# Patient Record
Sex: Male | Born: 1980 | Race: Black or African American | Hispanic: No | Marital: Single | Smoking: Heavy tobacco smoker
Health system: Southern US, Community
[De-identification: ages and names within clinical notes are randomized; demographics above are authoritative.]

## PROBLEM LIST (undated history)

## (undated) HISTORY — PX: OTHER SURGICAL HISTORY: SHX169

---

## 2008-12-09 ENCOUNTER — Observation Stay (HOSPITAL_COMMUNITY): Admission: EM | Admit: 2008-12-09 | Discharge: 2008-12-10 | Payer: Self-pay | Admitting: Emergency Medicine

## 2009-06-03 ENCOUNTER — Emergency Department (HOSPITAL_COMMUNITY): Admission: EM | Admit: 2009-06-03 | Discharge: 2009-06-03 | Payer: Self-pay | Admitting: Emergency Medicine

## 2009-06-24 ENCOUNTER — Emergency Department (HOSPITAL_COMMUNITY): Admission: EM | Admit: 2009-06-24 | Discharge: 2009-06-24 | Payer: Self-pay | Admitting: Emergency Medicine

## 2009-08-28 ENCOUNTER — Emergency Department (HOSPITAL_COMMUNITY): Admission: EM | Admit: 2009-08-28 | Discharge: 2009-08-28 | Payer: Self-pay | Admitting: Emergency Medicine

## 2009-09-11 ENCOUNTER — Emergency Department (HOSPITAL_COMMUNITY): Admission: EM | Admit: 2009-09-11 | Discharge: 2009-09-11 | Payer: Self-pay | Admitting: Emergency Medicine

## 2009-09-26 ENCOUNTER — Emergency Department (HOSPITAL_COMMUNITY): Admission: EM | Admit: 2009-09-26 | Discharge: 2009-09-26 | Payer: Self-pay | Admitting: Emergency Medicine

## 2009-10-09 ENCOUNTER — Emergency Department (HOSPITAL_COMMUNITY): Admission: EM | Admit: 2009-10-09 | Discharge: 2009-10-09 | Payer: Self-pay | Admitting: Family Medicine

## 2009-10-21 ENCOUNTER — Emergency Department (HOSPITAL_COMMUNITY): Admission: EM | Admit: 2009-10-21 | Discharge: 2009-10-22 | Payer: Self-pay | Admitting: Emergency Medicine

## 2009-10-28 ENCOUNTER — Emergency Department (HOSPITAL_COMMUNITY): Admission: EM | Admit: 2009-10-28 | Discharge: 2009-10-28 | Payer: Self-pay | Admitting: Emergency Medicine

## 2009-10-30 ENCOUNTER — Emergency Department (HOSPITAL_COMMUNITY): Admission: EM | Admit: 2009-10-30 | Discharge: 2009-10-30 | Payer: Self-pay | Admitting: Emergency Medicine

## 2009-12-23 ENCOUNTER — Emergency Department (HOSPITAL_COMMUNITY): Admission: EM | Admit: 2009-12-23 | Discharge: 2009-12-23 | Payer: Self-pay | Admitting: Family Medicine

## 2010-01-15 ENCOUNTER — Emergency Department (HOSPITAL_COMMUNITY): Admission: EM | Admit: 2010-01-15 | Discharge: 2010-01-15 | Payer: Self-pay | Admitting: Family Medicine

## 2010-11-18 LAB — DIFFERENTIAL
Basophils Relative: 2 % — ABNORMAL HIGH (ref 0–1)
Monocytes Absolute: 0.7 10*3/uL (ref 0.1–1.0)
Monocytes Relative: 8 % (ref 3–12)
Neutro Abs: 6.4 10*3/uL (ref 1.7–7.7)
Neutrophils Relative %: 66 % (ref 43–77)

## 2010-11-18 LAB — CBC
HCT: 48.8 % (ref 39.0–52.0)
Hemoglobin: 16.8 g/dL (ref 13.0–17.0)
MCHC: 34.4 g/dL (ref 30.0–36.0)
MCV: 95.9 fL (ref 78.0–100.0)
Platelets: 152 10*3/uL (ref 150–400)
RDW: 13 % (ref 11.5–15.5)

## 2010-11-18 LAB — BASIC METABOLIC PANEL
Chloride: 107 mEq/L (ref 96–112)
GFR calc non Af Amer: >60 mL/min (ref >60)

## 2010-11-18 LAB — CULTURE, BLOOD (ROUTINE X 2)
Culture: NO GROWTH
Culture: NO GROWTH

## 2011-06-13 ENCOUNTER — Emergency Department (HOSPITAL_COMMUNITY): Payer: Self-pay

## 2011-06-13 ENCOUNTER — Inpatient Hospital Stay (HOSPITAL_COMMUNITY)
Admission: EM | Admit: 2011-06-13 | Discharge: 2011-06-16 | DRG: 565 | Disposition: A | Discharge status: Home / Self Care | Payer: Self-pay | Attending: General Surgery | Admitting: General Surgery

## 2011-06-13 DIAGNOSIS — S0285XA Fracture of orbit, unspecified, initial encounter for closed fracture: Secondary | ICD-10-CM | POA: Diagnosis present

## 2011-06-13 DIAGNOSIS — S62639B Displaced fracture of distal phalanx of unspecified finger, initial encounter for open fracture: Secondary | ICD-10-CM | POA: Diagnosis present

## 2011-06-13 DIAGNOSIS — S6000XA Contusion of unspecified finger without damage to nail, initial encounter: Secondary | ICD-10-CM | POA: Diagnosis present

## 2011-06-13 DIAGNOSIS — S62637B Displaced fracture of distal phalanx of left little finger, initial encounter for open fracture: Secondary | ICD-10-CM | POA: Diagnosis present

## 2011-06-13 DIAGNOSIS — S060X9A Concussion with loss of consciousness of unspecified duration, initial encounter: Secondary | ICD-10-CM | POA: Diagnosis present

## 2011-06-13 DIAGNOSIS — S0180XA Unspecified open wound of other part of head, initial encounter: Secondary | ICD-10-CM

## 2011-06-13 DIAGNOSIS — F1210 Cannabis abuse, uncomplicated: Secondary | ICD-10-CM | POA: Diagnosis present

## 2011-06-13 DIAGNOSIS — S0280XA Fracture of other specified skull and facial bones, unspecified side, initial encounter for closed fracture: Principal | ICD-10-CM | POA: Diagnosis present

## 2011-06-13 DIAGNOSIS — F1010 Alcohol abuse, uncomplicated: Secondary | ICD-10-CM | POA: Diagnosis present

## 2011-06-13 DIAGNOSIS — S0100XA Unspecified open wound of scalp, initial encounter: Secondary | ICD-10-CM | POA: Diagnosis present

## 2011-06-13 DIAGNOSIS — S022XXA Fracture of nasal bones, initial encounter for closed fracture: Secondary | ICD-10-CM | POA: Diagnosis present

## 2011-06-13 DIAGNOSIS — S0181XA Laceration without foreign body of other part of head, initial encounter: Secondary | ICD-10-CM | POA: Diagnosis present

## 2011-06-13 DIAGNOSIS — S0230XA Fracture of orbital floor, unspecified side, initial encounter for closed fracture: Secondary | ICD-10-CM | POA: Diagnosis present

## 2011-06-13 DIAGNOSIS — F17200 Nicotine dependence, unspecified, uncomplicated: Secondary | ICD-10-CM | POA: Diagnosis present

## 2011-06-13 DIAGNOSIS — IMO0001 Reserved for inherently not codable concepts without codable children: Secondary | ICD-10-CM | POA: Diagnosis present

## 2011-06-13 DIAGNOSIS — S01409A Unspecified open wound of unspecified cheek and temporomandibular area, initial encounter: Secondary | ICD-10-CM | POA: Diagnosis present

## 2011-06-13 DIAGNOSIS — S060XAA Concussion with loss of consciousness status unknown, initial encounter: Secondary | ICD-10-CM | POA: Diagnosis present

## 2011-06-13 LAB — URINALYSIS, ROUTINE W REFLEX MICROSCOPIC
Bilirubin Urine: NEGATIVE
Glucose, UA: 250 mg/dL — AB
Leukocytes, UA: NEGATIVE
Nitrite: NEGATIVE

## 2011-06-13 LAB — DIFFERENTIAL
Basophils Absolute: 0.1 10*3/uL (ref 0.0–0.1)
Basophils Relative: 1 % (ref 0–1)
Eosinophils Absolute: 0.4 10*3/uL (ref 0.0–0.7)
Eosinophils Relative: 3 % (ref 0–5)
Lymphs Abs: 5.1 10*3/uL — ABNORMAL HIGH (ref 0.7–4.0)
Monocytes Absolute: 0.8 10*3/uL (ref 0.1–1.0)

## 2011-06-13 LAB — CBC
Hemoglobin: 16.5 g/dL (ref 13.0–17.0)
MCHC: 35.5 g/dL (ref 30.0–36.0)
RBC: 5.15 MIL/uL (ref 4.22–5.81)

## 2011-06-13 LAB — RAPID URINE DRUG SCREEN, HOSP PERFORMED
Barbiturates: NOT DETECTED
Cocaine: POSITIVE — AB
Tetrahydrocannabinol: POSITIVE — AB

## 2011-06-13 LAB — POCT I-STAT, CHEM 8
Chloride: 103 mEq/L (ref 96–112)
Potassium: 3 mEq/L — ABNORMAL LOW (ref 3.5–5.1)

## 2011-06-13 LAB — SAMPLE TO BLOOD BANK

## 2011-06-13 LAB — URINE MICROSCOPIC-ADD ON

## 2011-06-13 LAB — ETHANOL: Alcohol, Ethyl (B): 18 mg/dL — ABNORMAL HIGH (ref 0–11)

## 2011-06-13 MED ORDER — HYDROMORPHONE 0.3 MG/ML IV SOLN
INTRAVENOUS | Status: DC
  Administered 2011-06-14: 1.39 mg via INTRAVENOUS
  Administered 2011-06-14: 0.4 mg via INTRAVENOUS
  Administered 2011-06-14: 0.6 mg via INTRAVENOUS
  Administered 2011-06-14: 0.599 mg via INTRAVENOUS
  Administered 2011-06-14: 1.59 mg via INTRAVENOUS
  Administered 2011-06-14: 0.799 mg via INTRAVENOUS
  Filled 2011-06-13: qty 25

## 2011-06-13 MED ORDER — DIPHENHYDRAMINE HCL 12.5 MG/5ML PO ELIX
25.0000 mg | ORAL_SOLUTION | Freq: Four times a day (QID) | ORAL | Status: DC | PRN
  Filled 2011-06-13: qty 10

## 2011-06-13 MED ORDER — INFLUENZA VIRUS VACC SPLIT PF IM SUSP
0.5000 mL | Freq: Once | INTRAMUSCULAR | Status: AC
  Administered 2011-06-14: 0.5 mL via INTRAMUSCULAR
  Filled 2011-06-13: qty 0.5

## 2011-06-13 MED ORDER — ENOXAPARIN SODIUM 30 MG/0.3ML ~~LOC~~ SOLN
30.0000 mg | Freq: Two times a day (BID) | SUBCUTANEOUS | Status: DC
  Administered 2011-06-14 – 2011-06-15 (×4): 30 mg via SUBCUTANEOUS
  Filled 2011-06-13 (×8): qty 0.3

## 2011-06-13 MED ORDER — CEFAZOLIN SODIUM-DEXTROSE 2-3 GM-% IV SOLR
2.0000 g | Freq: Three times a day (TID) | INTRAVENOUS | Status: DC
  Administered 2011-06-14 – 2011-06-16 (×7): 2 g via INTRAVENOUS
  Filled 2011-06-13 (×11): qty 50

## 2011-06-13 MED ORDER — DIPHENHYDRAMINE HCL 50 MG/ML IJ SOLN: 12.5000 mg | Freq: Four times a day (QID) | INTRAMUSCULAR | Status: DC | PRN

## 2011-06-13 MED ORDER — SODIUM CHLORIDE 0.9 % IJ SOLN: 9.0000 mL | INTRAMUSCULAR | Status: DC | PRN

## 2011-06-13 MED ORDER — NALOXONE HCL 0.4 MG/ML IJ SOLN: 0.4000 mg | INTRAMUSCULAR | Status: DC | PRN

## 2011-06-13 MED ORDER — DOCUSATE SODIUM 100 MG PO CAPS
100.0000 mg | ORAL_CAPSULE | Freq: Two times a day (BID) | ORAL | Status: DC
  Administered 2011-06-14 – 2011-06-15 (×4): 100 mg via ORAL
  Filled 2011-06-13 (×2): qty 1

## 2011-06-13 MED ORDER — ONDANSETRON HCL 4 MG PO TABS: 4.0000 mg | ORAL_TABLET | ORAL | Status: DC | PRN

## 2011-06-13 MED ORDER — POLYETHYLENE GLYCOL 3350 17 G PO PACK
17.0000 g | PACK | Freq: Every day | ORAL | Status: DC
  Administered 2011-06-14 – 2011-06-15 (×2): 17 g via ORAL
  Filled 2011-06-13 (×4): qty 1

## 2011-06-13 MED ORDER — ONDANSETRON HCL 4 MG/2ML IJ SOLN: 4.0000 mg | Freq: Four times a day (QID) | INTRAMUSCULAR | Status: DC | PRN

## 2011-06-13 MED ORDER — BISACODYL 10 MG RE SUPP: 10.0000 mg | Freq: Every day | RECTAL | Status: DC | PRN

## 2011-06-13 MED ORDER — HYDROMORPHONE 0.3 MG/ML IV SOLN: INTRAVENOUS | Status: DC

## 2011-06-13 MED ORDER — SODIUM CHLORIDE 0.45 % IV SOLN
INTRAVENOUS | Status: DC
  Administered 2011-06-14 (×2): via INTRAVENOUS

## 2011-06-14 DIAGNOSIS — S0285XA Fracture of orbit, unspecified, initial encounter for closed fracture: Secondary | ICD-10-CM | POA: Diagnosis present

## 2011-06-14 DIAGNOSIS — S060XAA Concussion with loss of consciousness status unknown, initial encounter: Secondary | ICD-10-CM | POA: Diagnosis present

## 2011-06-14 DIAGNOSIS — F1290 Cannabis use, unspecified, uncomplicated: Secondary | ICD-10-CM | POA: Insufficient documentation

## 2011-06-14 DIAGNOSIS — Z7289 Other problems related to lifestyle: Secondary | ICD-10-CM | POA: Insufficient documentation

## 2011-06-14 DIAGNOSIS — S62637B Displaced fracture of distal phalanx of left little finger, initial encounter for open fracture: Secondary | ICD-10-CM | POA: Diagnosis present

## 2011-06-14 DIAGNOSIS — S0181XA Laceration without foreign body of other part of head, initial encounter: Secondary | ICD-10-CM | POA: Diagnosis present

## 2011-06-14 DIAGNOSIS — Z720 Tobacco use: Secondary | ICD-10-CM | POA: Insufficient documentation

## 2011-06-14 LAB — CBC
MCH: 32.6 pg (ref 26.0–34.0)
MCV: 91.1 fL (ref 78.0–100.0)
Platelets: 192 10*3/uL (ref 150–400)
RBC: 4.82 MIL/uL (ref 4.22–5.81)
RDW: 12.9 % (ref 11.5–15.5)
WBC: 9.2 10*3/uL (ref 4.0–10.5)

## 2011-06-14 LAB — COMPREHENSIVE METABOLIC PANEL
ALT: 19 U/L (ref 0–53)
BUN: 5 mg/dL — ABNORMAL LOW (ref 6–23)
CO2: 25 mEq/L (ref 19–32)
Calcium: 9.4 mg/dL (ref 8.4–10.5)
Creatinine, Ser: 0.9 mg/dL (ref 0.50–1.35)
GFR calc Af Amer: >90 mL/min (ref >90)
GFR calc non Af Amer: >90 mL/min (ref >90)
Glucose, Bld: 112 mg/dL — ABNORMAL HIGH (ref 70–99)
Sodium: 137 mEq/L (ref 135–145)

## 2011-06-14 MED ORDER — HYPROMELLOSE (GONIOSCOPIC) 2.5 % OP SOLN
2.0000 [drp] | Freq: Four times a day (QID) | OPHTHALMIC | Status: DC
  Filled 2011-06-14: qty 15

## 2011-06-14 MED ORDER — WHITE PETROLATUM GEL
Status: AC
  Administered 2011-06-14: 21:00:00
  Filled 2011-06-14: qty 5

## 2011-06-14 MED ORDER — POLYVINYL ALCOHOL 1.4 % OP SOLN
2.0000 [drp] | Freq: Four times a day (QID) | OPHTHALMIC | Status: DC
  Administered 2011-06-14 – 2011-06-15 (×8): 2 [drp] via OPHTHALMIC
  Filled 2011-06-14 (×3): qty 15

## 2011-06-14 MED ORDER — HYDROMORPHONE 0.3 MG/ML IV SOLN: INTRAVENOUS | Status: DC
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
## 2011-06-15 MED ORDER — CEPHALEXIN 500 MG PO CAPS: 500.0000 mg | ORAL_CAPSULE | Freq: Four times a day (QID) | ORAL | Status: AC

## 2011-06-15 MED ORDER — MORPHINE SULFATE 4 MG/ML IJ SOLN
4.0000 mg | INTRAMUSCULAR | Status: DC | PRN
  Filled 2011-06-15: qty 1

## 2011-06-15 MED ORDER — LORAZEPAM 1 MG PO TABS: 0.0000 mg | ORAL_TABLET | Freq: Two times a day (BID) | ORAL | Status: DC

## 2011-06-15 MED ORDER — BACITRACIN-NEOMYCIN-POLYMYXIN 400-5-5000 EX OINT
TOPICAL_OINTMENT | CUTANEOUS | Status: AC
  Filled 2011-06-15: qty 2

## 2011-06-15 MED ORDER — BACITRACIN-NEOMYCIN-POLYMYXIN 400-5-5000 EX OINT
TOPICAL_OINTMENT | CUTANEOUS | Status: AC
  Filled 2011-06-15: qty 1

## 2011-06-15 MED ORDER — LORAZEPAM 1 MG PO TABS: 0.0000 mg | ORAL_TABLET | Freq: Four times a day (QID) | ORAL | Status: DC

## 2011-06-15 MED ORDER — LORAZEPAM 1 MG PO TABS: 1.0000 mg | ORAL_TABLET | Freq: Four times a day (QID) | ORAL | Status: DC | PRN

## 2011-06-15 MED ORDER — HYDROCODONE-ACETAMINOPHEN 5-325 MG PO TABS
1.0000 | ORAL_TABLET | ORAL | Status: DC | PRN
  Administered 2011-06-15 – 2011-06-16 (×4): 2 via ORAL
  Filled 2011-06-15 (×4): qty 2

## 2011-06-15 MED ORDER — HYDROCODONE-ACETAMINOPHEN 5-325 MG PO TABS: 1.0000 | ORAL_TABLET | ORAL | Status: AC | PRN

## 2011-06-15 MED ORDER — THERA M PLUS PO TABS
1.0000 | ORAL_TABLET | Freq: Every day | ORAL | Status: DC
  Administered 2011-06-15: 1 via ORAL
  Filled 2011-06-15 (×2): qty 1

## 2011-06-15 MED ORDER — NICOTINE 21 MG/24HR TD PT24
21.0000 mg | MEDICATED_PATCH | Freq: Every day | TRANSDERMAL | Status: DC
  Administered 2011-06-15: 21 mg via TRANSDERMAL
  Filled 2011-06-15 (×2): qty 1

## 2011-06-15 MED ORDER — LORAZEPAM 2 MG/ML IJ SOLN
1.0000 mg | Freq: Four times a day (QID) | INTRAMUSCULAR | Status: DC | PRN
  Filled 2011-06-15: qty 1

## 2011-06-15 MED ORDER — FOLIC ACID 1 MG PO TABS
1.0000 mg | ORAL_TABLET | Freq: Every day | ORAL | Status: DC
  Administered 2011-06-15: 1 mg via ORAL
  Filled 2011-06-15 (×2): qty 1

## 2011-06-15 MED ORDER — VITAMIN B-1 100 MG PO TABS
100.0000 mg | ORAL_TABLET | Freq: Every day | ORAL | Status: DC
  Administered 2011-06-15: 100 mg via ORAL
  Filled 2011-06-15 (×2): qty 1

## 2011-06-15 MED ORDER — THIAMINE HCL 100 MG/ML IJ SOLN
100.0000 mg | Freq: Every day | INTRAMUSCULAR | Status: DC
  Filled 2011-06-15 (×2): qty 1
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
## 2011-06-15 NOTE — Progress Notes (Signed)
Elevated BP

## 2011-06-16 MED ORDER — NICOTINE 21 MG/24HR TD PT24: 21.0000 | MEDICATED_PATCH | Freq: Every day | TRANSDERMAL | Status: AC

## 2011-06-16 NOTE — Progress Notes (Signed)
D/C Instructions given to sister. She verbalized understanding. Meds given from Pharm. Refused out via wheelchair, Delphina Cahill out with friend.
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
## 2011-06-16 NOTE — Progress Notes (Signed)
Orthopedic Tech Progress Note Patient Details:  Matthew Martin [birth year] [id number]  Type of Splint: Finger Splint Location: left 5th finger splint    Gaye Pollack 06/16/2011, 8:58 AM

## 2011-07-25 ENCOUNTER — Encounter: Payer: Self-pay | Admitting: *Deleted

## 2011-07-25 ENCOUNTER — Emergency Department (HOSPITAL_COMMUNITY)
Admission: EM | Admit: 2011-07-25 | Discharge: 2011-07-25 | Disposition: A | Discharge status: Home / Self Care | Payer: Self-pay | Attending: Emergency Medicine | Admitting: Emergency Medicine

## 2011-07-25 DIAGNOSIS — Z4802 Encounter for removal of sutures: Secondary | ICD-10-CM | POA: Insufficient documentation

## 2011-07-25 NOTE — ED Provider Notes (Signed)
History     CSN: [id number] Arrival date & time: 07/25/2011  1:44 PM   First MD Initiated Contact with Patient 07/25/11 1358      Chief Complaint  Patient presents with  . Suture / Staple Removal    (Consider location/radiation/quality/duration/timing/severity/associated sxs/prior treatment) HPI Comments: Patient presents for suture removal. 4 sutures placed in left fifth digit 2-1/2 weeks ago. Patient states his finger was broken. Patient denies complications including swelling, worsening pain, drainage.  Patient is a 30 y.o. male presenting with suture removal. The history is provided by the patient.  Suture / Staple Removal  The sutures were placed more than 14 days ago. Treatments since wound repair include regular soap and water washings. There has been no drainage from the wound. There is no redness present. There is no swelling present. The pain has improved.    History reviewed. No pertinent past medical history.  History reviewed. No pertinent past surgical history.  No family history on file.  History  Substance Use Topics  . Smoking status: Not on file  . Smokeless tobacco: Not on file  . Alcohol Use: Yes      Review of Systems  Constitutional: Negative for activity change.  HENT: Negative for neck pain.   Musculoskeletal: Positive for arthralgias. Negative for back pain.  Skin: Negative for wound.  Neurological: Negative for weakness and numbness.    Allergies  Review of patient's allergies indicates no known allergies.  Home Medications  No current outpatient prescriptions on file.  BP 140/69  Pulse 74  Temp(Src) 97.9 F (36.6 C) (Oral)  Resp 16  SpO2 95%  Physical Exam  Nursing note and vitals reviewed. Constitutional: He is oriented to person, place, and time. He appears well-developed and well-nourished.  HENT:  Head: Normocephalic and atraumatic.  Eyes: Conjunctivae are normal.  Neck: Normal range of motion.  Musculoskeletal: He  exhibits tenderness. He exhibits no edema.       Patient with 4 Prolene sutures in place around the fingernail of the left fifth digit. Sutures removed without complication. Wound appears to be well healing. There is no evidence of infection, paronychia, or felon of the finger. Patient has full range of motion in the finger with some tenderness.  Neurological: He is alert and oriented to person, place, and time.  Skin: Skin is warm and dry. No erythema.    ED Course  Procedures (including critical care time)  Labs Reviewed - No data to display No results found.   1. Visit for suture removal    2:12 PM patient seen and examined. Sutures removed without complication. Patient counseled on signs and symptoms to return.   MDM  Uncomplicated suture removal. No infection of the finger or complications with wound healing.       Eustace Moore [place], [place] 07/25/11 [phone]

## 2011-07-25 NOTE — ED Provider Notes (Signed)
Evaluation and management procedures were performed by the PA/NP under my supervision/collaboration.   Dione Booze, MD 07/25/11 1501

## 2011-07-25 NOTE — ED Notes (Signed)
Returned for suture removal left 5th digit

## 2013-04-09 ENCOUNTER — Emergency Department (HOSPITAL_COMMUNITY): Payer: No Typology Code available for payment source

## 2013-04-09 ENCOUNTER — Emergency Department (HOSPITAL_COMMUNITY)
Admission: EM | Admit: 2013-04-09 | Discharge: 2013-04-09 | Disposition: A | Discharge status: Home / Self Care | Payer: No Typology Code available for payment source | Attending: Emergency Medicine | Admitting: Emergency Medicine

## 2013-04-09 ENCOUNTER — Encounter (HOSPITAL_COMMUNITY): Payer: Self-pay

## 2013-04-09 DIAGNOSIS — Z87828 Personal history of other (healed) physical injury and trauma: Secondary | ICD-10-CM | POA: Insufficient documentation

## 2013-04-09 DIAGNOSIS — S139XXA Sprain of joints and ligaments of unspecified parts of neck, initial encounter: Secondary | ICD-10-CM | POA: Insufficient documentation

## 2013-04-09 DIAGNOSIS — Y92410 Unspecified street and highway as the place of occurrence of the external cause: Secondary | ICD-10-CM | POA: Insufficient documentation

## 2013-04-09 DIAGNOSIS — F17200 Nicotine dependence, unspecified, uncomplicated: Secondary | ICD-10-CM | POA: Insufficient documentation

## 2013-04-09 DIAGNOSIS — S335XXA Sprain of ligaments of lumbar spine, initial encounter: Secondary | ICD-10-CM | POA: Insufficient documentation

## 2013-04-09 DIAGNOSIS — Y9389 Activity, other specified: Secondary | ICD-10-CM | POA: Insufficient documentation

## 2013-04-09 DIAGNOSIS — S39012A Strain of muscle, fascia and tendon of lower back, initial encounter: Secondary | ICD-10-CM

## 2013-04-09 DIAGNOSIS — S161XXA Strain of muscle, fascia and tendon at neck level, initial encounter: Secondary | ICD-10-CM

## 2013-04-09 MED ORDER — NAPROXEN 500 MG PO TABS: 500.0000 mg | ORAL_TABLET | Freq: Two times a day (BID) | ORAL | Status: AC

## 2013-04-09 MED ORDER — OXYCODONE-ACETAMINOPHEN 5-325 MG PO TABS
1.0000 | ORAL_TABLET | Freq: Once | ORAL | Status: AC
  Administered 2013-04-09: 1 via ORAL
  Filled 2013-04-09: qty 1

## 2013-04-09 MED ORDER — CYCLOBENZAPRINE HCL 10 MG PO TABS: 10.0000 mg | ORAL_TABLET | Freq: Two times a day (BID) | ORAL | Status: AC | PRN
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
## 2013-04-09 NOTE — ED Provider Notes (Signed)
Medical screening examination/treatment/procedure(s) were performed by non-physician practitioner and as supervising physician I was immediately available for consultation/collaboration.   Audree Camel, MD 04/09/13 [phone]
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
## 2014-04-21 ENCOUNTER — Emergency Department (HOSPITAL_COMMUNITY)
Admission: EM | Admit: 2014-04-21 | Discharge: 2014-04-21 | Disposition: A | Discharge status: Home / Self Care | Payer: No Typology Code available for payment source | Attending: Emergency Medicine | Admitting: Emergency Medicine

## 2014-04-21 ENCOUNTER — Encounter (HOSPITAL_COMMUNITY): Payer: Self-pay | Admitting: Emergency Medicine

## 2014-04-21 DIAGNOSIS — IMO0002 Reserved for concepts with insufficient information to code with codable children: Secondary | ICD-10-CM | POA: Diagnosis not present

## 2014-04-21 DIAGNOSIS — J029 Acute pharyngitis, unspecified: Secondary | ICD-10-CM

## 2014-04-21 DIAGNOSIS — Y92410 Unspecified street and highway as the place of occurrence of the external cause: Secondary | ICD-10-CM | POA: Insufficient documentation

## 2014-04-21 DIAGNOSIS — Z791 Long term (current) use of non-steroidal anti-inflammatories (NSAID): Secondary | ICD-10-CM | POA: Insufficient documentation

## 2014-04-21 DIAGNOSIS — R05 Cough: Secondary | ICD-10-CM | POA: Diagnosis not present

## 2014-04-21 DIAGNOSIS — R059 Cough, unspecified: Secondary | ICD-10-CM | POA: Diagnosis not present

## 2014-04-21 DIAGNOSIS — Y9389 Activity, other specified: Secondary | ICD-10-CM | POA: Diagnosis not present

## 2014-04-21 DIAGNOSIS — F17200 Nicotine dependence, unspecified, uncomplicated: Secondary | ICD-10-CM | POA: Insufficient documentation

## 2014-04-21 DIAGNOSIS — J3489 Other specified disorders of nose and nasal sinuses: Secondary | ICD-10-CM | POA: Insufficient documentation

## 2014-04-21 LAB — RAPID STREP SCREEN (MED CTR MEBANE ONLY): STREPTOCOCCUS, GROUP A SCREEN (DIRECT): NEGATIVE

## 2014-04-21 MED ORDER — METHOCARBAMOL 500 MG PO TABS: 500.0000 mg | ORAL_TABLET | Freq: Two times a day (BID) | ORAL | Status: DC | PRN

## 2014-04-21 MED ORDER — HYDROCODONE-HOMATROPINE 5-1.5 MG/5ML PO SYRP
5.0000 mL | ORAL_SOLUTION | Freq: Once | ORAL | Status: AC
  Administered 2014-04-21: 5 mL via ORAL
  Filled 2014-04-21: qty 5

## 2014-04-21 MED ORDER — HYDROCODONE-HOMATROPINE 5-1.5 MG/5ML PO SYRP: 5.0000 mL | ORAL_SOLUTION | Freq: Four times a day (QID) | ORAL | Status: AC | PRN
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
## 2014-04-22 NOTE — ED Provider Notes (Signed)
Medical screening examination/treatment/procedure(s) were performed by non-physician practitioner and as supervising physician I was immediately available for consultation/collaboration.   Tray Klayman L Thierno Hun, MD 04/22/14 1018 

## 2014-04-23 LAB — CULTURE, GROUP A STREP

## 2015-03-10 ENCOUNTER — Emergency Department (HOSPITAL_COMMUNITY)
Admission: EM | Admit: 2015-03-10 | Discharge: 2015-03-10 | Disposition: A | Discharge status: Home / Self Care | Payer: No Typology Code available for payment source | Attending: Emergency Medicine | Admitting: Emergency Medicine

## 2015-03-10 ENCOUNTER — Encounter (HOSPITAL_COMMUNITY): Payer: Self-pay | Admitting: Emergency Medicine

## 2015-03-10 DIAGNOSIS — Y9389 Activity, other specified: Secondary | ICD-10-CM | POA: Insufficient documentation

## 2015-03-10 DIAGNOSIS — Z87828 Personal history of other (healed) physical injury and trauma: Secondary | ICD-10-CM | POA: Insufficient documentation

## 2015-03-10 DIAGNOSIS — Y92410 Unspecified street and highway as the place of occurrence of the external cause: Secondary | ICD-10-CM | POA: Diagnosis not present

## 2015-03-10 DIAGNOSIS — Y998 Other external cause status: Secondary | ICD-10-CM | POA: Insufficient documentation

## 2015-03-10 DIAGNOSIS — Z720 Tobacco use: Secondary | ICD-10-CM | POA: Diagnosis not present

## 2015-03-10 DIAGNOSIS — S0990XA Unspecified injury of head, initial encounter: Secondary | ICD-10-CM | POA: Insufficient documentation

## 2015-03-10 MED ORDER — IBUPROFEN 600 MG PO TABS: 600.0000 mg | ORAL_TABLET | Freq: Four times a day (QID) | ORAL | Status: AC | PRN

## 2015-03-10 MED ORDER — METHOCARBAMOL 500 MG PO TABS: 500.0000 mg | ORAL_TABLET | Freq: Two times a day (BID) | ORAL | Status: AC

## 2015-03-10 MED ORDER — KETOROLAC TROMETHAMINE 60 MG/2ML IM SOLN
60.0000 mg | Freq: Once | INTRAMUSCULAR | Status: AC
  Administered 2015-03-10: 60 mg via INTRAMUSCULAR
  Filled 2015-03-10: qty 2

## 2015-03-10 MED ORDER — IBUPROFEN 800 MG PO TABS: 800.0000 mg | ORAL_TABLET | Freq: Once | ORAL | Status: DC

## 2015-03-10 MED ORDER — DIAZEPAM 5 MG PO TABS
5.0000 mg | ORAL_TABLET | Freq: Once | ORAL | Status: AC
  Administered 2015-03-10: 5 mg via ORAL
  Filled 2015-03-10: qty 1
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
